# Patient Record
Sex: Male | Born: 1965 | Race: White | Hispanic: No | Marital: Married | Smoking: Former smoker
Health system: Southern US, Community
[De-identification: ages and names within clinical notes are randomized; demographics above are authoritative.]

## PROBLEM LIST (undated history)

## (undated) DIAGNOSIS — N289 Disorder of kidney and ureter, unspecified: Secondary | ICD-10-CM

## (undated) DIAGNOSIS — I10 Essential (primary) hypertension: Secondary | ICD-10-CM

## (undated) HISTORY — DX: Essential (primary) hypertension: I10

## (undated) HISTORY — PX: TONSILLECTOMY: SUR1361

---

## 1998-07-03 ENCOUNTER — Emergency Department (HOSPITAL_COMMUNITY): Admission: EM | Admit: 1998-07-03 | Discharge: 1998-07-04 | Payer: Self-pay | Admitting: Emergency Medicine

## 1998-07-03 ENCOUNTER — Encounter: Payer: Self-pay | Admitting: Emergency Medicine

## 2000-09-25 ENCOUNTER — Other Ambulatory Visit: Admission: RE | Admit: 2000-09-25 | Discharge: 2000-09-25 | Payer: Self-pay | Admitting: Urology

## 2005-12-13 ENCOUNTER — Encounter: Admission: RE | Admit: 2005-12-13 | Discharge: 2005-12-13 | Payer: Self-pay | Admitting: Internal Medicine

## 2013-01-05 ENCOUNTER — Ambulatory Visit (INDEPENDENT_AMBULATORY_CARE_PROVIDER_SITE_OTHER): Payer: Managed Care, Other (non HMO) | Admitting: Family Medicine

## 2013-01-05 VITALS — BP 110/70 | HR 93 | Temp 98.0°F | Resp 16 | Ht 72.0 in | Wt 260.0 lb

## 2013-01-05 DIAGNOSIS — J209 Acute bronchitis, unspecified: Secondary | ICD-10-CM

## 2013-01-05 DIAGNOSIS — R05 Cough: Secondary | ICD-10-CM

## 2013-01-05 DIAGNOSIS — R059 Cough, unspecified: Secondary | ICD-10-CM

## 2013-01-05 MED ORDER — HYDROCODONE-HOMATROPINE 5-1.5 MG/5ML PO SYRP: 5.0000 mL | ORAL_SOLUTION | Freq: Three times a day (TID) | ORAL | Status: DC | PRN

## 2013-01-05 MED ORDER — AZITHROMYCIN 250 MG PO TABS: ORAL_TABLET | ORAL | Status: DC

## 2013-01-05 NOTE — Progress Notes (Signed)
  Subjective:    Patient ID: Christopher Stark, male    DOB: [birth year], 47 y.o.   MRN: [id number] This chart was scribed for Elvina Sidle, MD by Danella Maiers, ED Scribe. This patient was seen in room 8 and the patient's care was started at 12:14 PM.  Chief Complaint  Patient presents with  . Cough    congestion x 1 week    HPI HPI Comments: Christopher Stark is a 48 y.o. male who presents to the [facility] complaining of cough, congestion, diaphoresis for the past 4-5 days. He states his son was sick 4-5 weeks ago and his wife was sick one week ago with sinus infection and acute pneumonia. They went to [employer] together this week and he has been feeling bad ever since. His wife was given a Z-pack with improvement within 2-3 days. He states he has not been sick in 2-3 years. He denies rhinorrhea. He does not smoke. He denies h/o asthma.   Review of Systems No nausea, vomiting, fever    Objective:   Physical Exam HEENT: mucopurulent nasal discharge Chest: few deep ronchi right chest Skin:  clear    Assessment & Plan:      Acute bronchitis - Plan: azithromycin (ZITHROMAX Z-PAK) 250 MG tablet, HYDROcodone-homatropine (HYCODAN) 5-1.5 MG/5ML syrup  Signed, Elvina Sidle, MD

## 2013-01-05 NOTE — Patient Instructions (Signed)

## 2013-07-09 ENCOUNTER — Ambulatory Visit (INDEPENDENT_AMBULATORY_CARE_PROVIDER_SITE_OTHER): Payer: Managed Care, Other (non HMO) | Admitting: Physician Assistant

## 2013-07-09 VITALS — BP 120/80 | HR 110 | Temp 98.1°F | Resp 16 | Ht 72.0 in | Wt 273.0 lb

## 2013-07-09 DIAGNOSIS — R059 Cough, unspecified: Secondary | ICD-10-CM

## 2013-07-09 DIAGNOSIS — R0981 Nasal congestion: Secondary | ICD-10-CM

## 2013-07-09 DIAGNOSIS — J988 Other specified respiratory disorders: Secondary | ICD-10-CM

## 2013-07-09 DIAGNOSIS — R05 Cough: Secondary | ICD-10-CM

## 2013-07-09 DIAGNOSIS — J22 Unspecified acute lower respiratory infection: Secondary | ICD-10-CM

## 2013-07-09 DIAGNOSIS — J3489 Other specified disorders of nose and nasal sinuses: Secondary | ICD-10-CM

## 2013-07-09 MED ORDER — HYDROCODONE-HOMATROPINE 5-1.5 MG/5ML PO SYRP: 5.0000 mL | ORAL_SOLUTION | Freq: Three times a day (TID) | ORAL | Status: DC | PRN

## 2013-07-09 MED ORDER — AZITHROMYCIN 250 MG PO TABS: ORAL_TABLET | ORAL | Status: DC

## 2013-07-09 MED ORDER — BENZONATATE 100 MG PO CAPS: 100.0000 mg | ORAL_CAPSULE | Freq: Three times a day (TID) | ORAL | Status: DC | PRN

## 2013-07-09 NOTE — Progress Notes (Signed)
   Subjective:    Patient ID: Christopher Stark, male    DOB: 02/16/1966, 48 y.o.   MRN: [id number]  HPI 48 year old male presents for evaluation of 5 day history of nasal congestion, sore throat, bilateral ear pressure, dry, hacking cough.  Has had subjective fever and chills. Admits to sinus pain/pressure, chest pain while coughing. Has taken an OTC allergy medicine and used Dayquil/Nyquil which has not helped.   No known sick contacts.  Hx of bronchitis.  Patient is otherwise healthy with no other concerns today.     Review of Systems  Constitutional: Positive for fever (subjective) and chills.  HENT: Positive for congestion, ear pain, postnasal drip, rhinorrhea, sinus pressure and sore throat.   Respiratory: Negative for cough, chest tightness, shortness of breath and wheezing.   Gastrointestinal: Negative for nausea, vomiting and abdominal pain.  Neurological: Negative for dizziness and headaches.       Objective:   Physical Exam  Constitutional: He is oriented to person, place, and time. He appears well-developed and well-nourished.  HENT:  Head: Normocephalic and atraumatic.  Right Ear: Hearing, tympanic membrane, external ear and ear canal normal.  Left Ear: Hearing, tympanic membrane, external ear and ear canal normal.  Mouth/Throat: Uvula is midline, oropharynx is clear and moist and mucous membranes are normal.  Eyes: Conjunctivae are normal.  Neck: Normal range of motion. Neck supple.  Cardiovascular: Normal rate, regular rhythm and normal heart sounds.   Pulmonary/Chest: Effort normal and breath sounds normal.  Lymphadenopathy:    He has no cervical adenopathy.  Neurological: He is alert and oriented to person, place, and time.  Psychiatric: He has a normal mood and affect. His behavior is normal. Judgment and thought content normal.          Assessment & Plan:  Lower respiratory infection (e.g., bronchitis, pneumonia, pneumonitis, pulmonitis) - Plan: azithromycin  (ZITHROMAX) 250 MG tablet  Cough - Plan: HYDROcodone-homatropine (HYCODAN) 5-1.5 MG/5ML syrup, benzonatate (TESSALON) 100 MG capsule  Nasal congestion  Will treat with Zpack as directed Hycodan qhs prn cough Continue OTC Zyrtec daily Push fluids Follow up if symptoms worsen or fail to improve.

## 2017-04-25 ENCOUNTER — Encounter (HOSPITAL_COMMUNITY): Payer: Self-pay | Admitting: Emergency Medicine

## 2017-04-25 ENCOUNTER — Emergency Department (HOSPITAL_COMMUNITY): Payer: Managed Care, Other (non HMO)

## 2017-04-25 ENCOUNTER — Emergency Department (HOSPITAL_COMMUNITY)
Admission: EM | Admit: 2017-04-25 | Discharge: 2017-04-25 | Disposition: A | Discharge status: Home / Self Care | Payer: Managed Care, Other (non HMO) | Attending: Emergency Medicine | Admitting: Emergency Medicine

## 2017-04-25 DIAGNOSIS — Z79899 Other long term (current) drug therapy: Secondary | ICD-10-CM | POA: Insufficient documentation

## 2017-04-25 DIAGNOSIS — R1031 Right lower quadrant pain: Secondary | ICD-10-CM | POA: Diagnosis present

## 2017-04-25 DIAGNOSIS — N200 Calculus of kidney: Secondary | ICD-10-CM | POA: Diagnosis not present

## 2017-04-25 DIAGNOSIS — Z87891 Personal history of nicotine dependence: Secondary | ICD-10-CM | POA: Insufficient documentation

## 2017-04-25 HISTORY — DX: Disorder of kidney and ureter, unspecified: N28.9

## 2017-04-25 LAB — URINALYSIS, ROUTINE W REFLEX MICROSCOPIC
Bilirubin Urine: NEGATIVE
Glucose, UA: NEGATIVE mg/dL
Ketones, ur: 5 mg/dL — AB
Leukocytes, UA: NEGATIVE
Nitrite: NEGATIVE
Protein, ur: 30 mg/dL — AB
Specific Gravity, Urine: 1.019 (ref 1.005–1.030)
pH: 5 (ref 5.0–8.0)

## 2017-04-25 LAB — CBC WITH DIFFERENTIAL/PLATELET
Basophils Absolute: 0.1 10*3/uL (ref 0.0–0.1)
Basophils Relative: 1 %
EOS ABS: 0.1 10*3/uL (ref 0.0–0.7)
Eosinophils Relative: 1 %
HCT: 44.8 % (ref 39.0–52.0)
Hemoglobin: 14.8 g/dL (ref 13.0–17.0)
LYMPHS ABS: 1.9 10*3/uL (ref 0.7–4.0)
Lymphocytes Relative: 19 %
MCH: 28.9 pg (ref 26.0–34.0)
MCHC: 33 g/dL (ref 30.0–36.0)
MCV: 87.5 fL (ref 78.0–100.0)
Monocytes Absolute: 0.6 10*3/uL (ref 0.1–1.0)
Monocytes Relative: 6 %
NEUTROS PCT: 75 %
Neutro Abs: 7.9 10*3/uL — ABNORMAL HIGH (ref 1.7–7.7)
PLATELETS: 224 10*3/uL (ref 150–400)
RBC: 5.12 MIL/uL (ref 4.22–5.81)
RDW: 14.1 % (ref 11.5–15.5)
WBC: 10.5 10*3/uL (ref 4.0–10.5)

## 2017-04-25 LAB — BASIC METABOLIC PANEL
Anion gap: 10 (ref 5–15)
BUN: 12 mg/dL (ref 6–20)
CALCIUM: 9.2 mg/dL (ref 8.9–10.3)
CHLORIDE: 107 mmol/L (ref 101–111)
CO2: 22 mmol/L (ref 22–32)
CREATININE: 0.98 mg/dL (ref 0.61–1.24)
GFR calc non Af Amer: >60 mL/min (ref >60)
Glucose, Bld: 140 mg/dL — ABNORMAL HIGH (ref 65–99)
Potassium: 3.8 mmol/L (ref 3.5–5.1)
SODIUM: 139 mmol/L (ref 135–145)

## 2017-04-25 MED ORDER — ONDANSETRON HCL 4 MG/2ML IJ SOLN
4.0000 mg | Freq: Once | INTRAMUSCULAR | Status: AC
  Administered 2017-04-25: 4 mg via INTRAVENOUS
  Filled 2017-04-25: qty 2

## 2017-04-25 MED ORDER — OXYCODONE-ACETAMINOPHEN 5-325 MG PO TABS
1.0000 | ORAL_TABLET | ORAL | Status: DC | PRN
  Administered 2017-04-25: 1 via ORAL
  Filled 2017-04-25: qty 1

## 2017-04-25 MED ORDER — SODIUM CHLORIDE 0.9 % IV SOLN
INTRAVENOUS | Status: DC
  Administered 2017-04-25: 17:00:00 via INTRAVENOUS

## 2017-04-25 MED ORDER — IBUPROFEN 800 MG PO TABS: 800.0000 mg | ORAL_TABLET | Freq: Three times a day (TID) | ORAL | 0 refills | Status: AC | PRN

## 2017-04-25 MED ORDER — FENTANYL CITRATE (PF) 100 MCG/2ML IJ SOLN
50.0000 ug | Freq: Once | INTRAMUSCULAR | Status: AC
  Administered 2017-04-25: 50 ug via INTRAVENOUS
  Filled 2017-04-25: qty 2

## 2017-04-25 MED ORDER — ONDANSETRON 4 MG PO TBDP: 4.0000 mg | ORAL_TABLET | Freq: Three times a day (TID) | ORAL | 0 refills | Status: AC | PRN

## 2017-04-25 MED ORDER — OXYCODONE-ACETAMINOPHEN 5-325 MG PO TABS: 1.0000 | ORAL_TABLET | Freq: Four times a day (QID) | ORAL | 0 refills | Status: AC | PRN

## 2017-04-25 MED ORDER — SODIUM CHLORIDE 0.9 % IV BOLUS (SEPSIS)
1000.0000 mL | Freq: Once | INTRAVENOUS | Status: AC
  Administered 2017-04-25: 1000 mL via INTRAVENOUS

## 2017-04-25 MED ORDER — KETOROLAC TROMETHAMINE 15 MG/ML IJ SOLN
15.0000 mg | Freq: Once | INTRAMUSCULAR | Status: AC
  Administered 2017-04-25: 15 mg via INTRAVENOUS
  Filled 2017-04-25: qty 1

## 2017-04-25 MED ORDER — TAMSULOSIN HCL 0.4 MG PO CAPS: 0.4000 mg | ORAL_CAPSULE | Freq: Every day | ORAL | 0 refills | Status: AC

## 2017-04-25 NOTE — ED Provider Notes (Signed)
[facility]-EMERGENCY DEPT Provider Note   CSN: [id number] Arrival date & time: 04/25/17  1254   History   Chief Complaint Chief Complaint  Patient presents with  . Flank Pain    HPI Christopher Stark is a 52 y.o. male with a hx of nephrolithiasis who presents to the ED from PCP office for progressively worsening R flank pain that started this morning at 10:00. States pain was waxing/waning- has become constant. States it is currently a 12/10 in severity without specific alleviating/aggravating factors.  Reports associated nausea with an episode of nonbloody emesis.  Patient was in the process of establishing care with a new primary care provider in [facility] when symptoms started to worsen.  Patient states that they performed a UA which showed blood in his urine, relayed they drew blood, but do not have results.  States x-ray was obtained to evaluate for nephrolithiasis and was inconclusive.  Patient was instructed to come to the emergency department for CT scan. Patient states hx of similar with kidney stones. Denies fever, dysuria, frequency, urgency, testicular pain, or abdominal pain.   HPI  Past Medical History:  Diagnosis Date  . Renal disorder    kindey stones    There are no active problems to display for this patient.   Past Surgical History:  Procedure Laterality Date  . TONSILLECTOMY         Home Medications    Prior to Admission medications   Medication Sig Start Date End Date Taking? Authorizing Provider  azithromycin (ZITHROMAX) 250 MG tablet Take 2 tabs PO x 1 dose, then 1 tab PO QD x 4 days 07/09/13   Rhoderick Moody M, PA-C  benzonatate (TESSALON) 100 MG capsule Take 1-2 capsules (100-200 mg total) by mouth 3 (three) times daily as needed for cough. 07/09/13   Nelva Nay, PA-C  HYDROcodone-homatropine (HYCODAN) 5-1.5 MG/5ML syrup Take 5 mLs by mouth every 8 (eight) hours as needed for cough. 07/09/13   Nelva Nay, PA-C    Family History Family History  Problem Relation Age of Onset  . Healthy Mother   . Healthy Father   . Healthy Sister     Social History Social History   Tobacco Use  . Smoking status: Former Smoker  Substance Use Topics  . Alcohol use: No  . Drug use: No     Allergies   Patient has no known allergies.   Review of Systems Review of Systems  Constitutional: Negative for chills and fever.  Respiratory: Negative for shortness of breath.   Cardiovascular: Negative for chest pain.  Gastrointestinal: Positive for nausea and vomiting. Negative for blood in stool, constipation and diarrhea.  Genitourinary: Positive for flank pain and hematuria (per UA at PCP). Negative for discharge, dysuria, frequency, scrotal swelling, testicular pain and urgency.  All other systems reviewed and are negative.  Physical Exam Updated Vital Signs BP (!) 141/94 (BP Location: Left Arm)   Pulse 83   Temp 98.8 F (37.1 C) (Oral)   Resp 16   Ht 5\' 11"  (1.803 m)   Wt 127.5 kg (281 lb)   SpO2 96%   BMI 39.19 kg/m   Physical Exam  Constitutional: He appears well-developed and well-nourished. He appears distressed (mild secondary to pain).  HENT:  Head: Normocephalic and atraumatic.  Eyes: Conjunctivae are normal. Right eye exhibits no discharge. Left eye exhibits no discharge.  Cardiovascular: Normal rate and regular rhythm.  No murmur heard. Pulmonary/Chest: Breath sounds normal.  No respiratory distress. He has no wheezes. He has no rales.  Abdominal: Soft. Normal appearance. He exhibits no distension. There is no tenderness. There is no rigidity, no rebound, no guarding, no CVA tenderness and no tenderness at McBurney's point.  Neurological: He is alert.  Clear speech.   Skin: Skin is warm and dry. No rash noted.  Psychiatric: He has a normal mood and affect. His behavior is normal.  Nursing note and vitals reviewed.  ED Treatments / Results  Labs Results for orders placed  or performed during the hospital encounter of 04/25/17  Urinalysis, Routine w reflex microscopic- may I&O cath if menses  Result Value Ref Range   Color, Urine YELLOW YELLOW   APPearance CLEAR CLEAR   Specific Gravity, Urine 1.019 1.005 - 1.030   pH 5.0 5.0 - 8.0   Glucose, UA NEGATIVE NEGATIVE mg/dL   Hgb urine dipstick LARGE (A) NEGATIVE   Bilirubin Urine NEGATIVE NEGATIVE   Ketones, ur 5 (A) NEGATIVE mg/dL   Protein, ur 30 (A) NEGATIVE mg/dL   Nitrite NEGATIVE NEGATIVE   Leukocytes, UA NEGATIVE NEGATIVE   RBC / HPF 6-30 0 - 5 RBC/hpf   WBC, UA 0-5 0 - 5 WBC/hpf   Bacteria, UA RARE (A) NONE SEEN   Squamous Epithelial / LPF 0-5 (A) NONE SEEN   Mucus PRESENT   Basic metabolic panel  Result Value Ref Range   Sodium 139 135 - 145 mmol/L   Potassium 3.8 3.5 - 5.1 mmol/L   Chloride 107 101 - 111 mmol/L   CO2 22 22 - 32 mmol/L   Glucose, Bld 140 (H) 65 - 99 mg/dL   BUN 12 6 - 20 mg/dL   Creatinine, Ser 1.61 0.61 - 1.24 mg/dL   Calcium 9.2 8.9 - 09.6 mg/dL   GFR calc non Af Amer >60 >60 mL/min   GFR calc Af Amer >60 >60 mL/min   Anion gap 10 5 - 15  CBC with Differential  Result Value Ref Range   WBC 10.5 4.0 - 10.5 K/uL   RBC 5.12 4.22 - 5.81 MIL/uL   Hemoglobin 14.8 13.0 - 17.0 g/dL   HCT 04.5 40.9 - 81.1 %   MCV 87.5 78.0 - 100.0 fL   MCH 28.9 26.0 - 34.0 pg   MCHC 33.0 30.0 - 36.0 g/dL   RDW 91.4 78.2 - 95.6 %   Platelets 224 150 - 400 K/uL   Neutrophils Relative % 75 %   Neutro Abs 7.9 (H) 1.7 - 7.7 K/uL   Lymphocytes Relative 19 %   Lymphs Abs 1.9 0.7 - 4.0 K/uL   Monocytes Relative 6 %   Monocytes Absolute 0.6 0.1 - 1.0 K/uL   Eosinophils Relative 1 %   Eosinophils Absolute 0.1 0.0 - 0.7 K/uL   Basophils Relative 1 %   Basophils Absolute 0.1 0.0 - 0.1 K/uL   EKG  EKG Interpretation None      Radiology Ct Renal Stone Study  Result Date: 04/25/2017 CLINICAL DATA:  Acute right flank pain. EXAM: CT ABDOMEN AND PELVIS WITHOUT CONTRAST TECHNIQUE:  Multidetector CT imaging of the abdomen and pelvis was performed following the standard protocol without IV contrast. COMPARISON:  None. FINDINGS: Lower chest: No acute abnormality. Hepatobiliary: No gallstones are noted. Fatty infiltration of the liver is noted with sparing adjacent to gallbladder fossa. No biliary dilatation is noted. Pancreas: Unremarkable. No pancreatic ductal dilatation or surrounding inflammatory changes. Spleen: Normal in size without focal abnormality. Adrenals/Urinary Tract: Adrenal glands  appear normal. Left kidney and ureter are unremarkable. Mild right hydroureteronephrosis is noted secondary to 4 mm calculus at right ureterovesical junction. Urinary bladder is decompressed. Stomach/Bowel: Stomach is within normal limits. Appendix appears normal. No evidence of bowel wall thickening, distention, or inflammatory changes. Vascular/Lymphatic: No significant vascular findings are present. No enlarged abdominal or pelvic lymph nodes. Reproductive: Prostate is unremarkable. Other: 4.3 x 3.5 cm rounded well-defined abnormality is seen in the mesenteric region of the left upper quadrant with peripheral calcifications, most consistent with benign cyst or mass lesion. Musculoskeletal: No acute or significant osseous findings. IMPRESSION: Mild right hydroureteronephrosis is noted secondary to 4 mm calculus at right ureterovesical junction. Fatty infiltration of the liver. 4.3 cm rounded well-defined soft tissue abnormality is seen in mesenteric region of left upper quadrant with peripheral calcifications, most consistent with benign cyst, lymph node or mass lesion. Electronically Signed   By: Lupita RaiderJames  Green Jr, M.D.   On: 04/25/2017 15:41   Procedures Procedures (including critical care time)  Medications Ordered in ED Medications  oxyCODONE-acetaminophen (PERCOCET/ROXICET) 5-325 MG per tablet 1 tablet (1 tablet Oral Given 04/25/17 1410)  sodium chloride 0.9 % bolus 1,000 mL (1,000 mLs  Intravenous New Bag/Given 04/25/17 1500)    And  0.9 %  sodium chloride infusion (not administered)  ketorolac (TORADOL) 15 MG/ML injection 15 mg (not administered)  fentaNYL (SUBLIMAZE) injection 50 mcg (not administered)  fentaNYL (SUBLIMAZE) injection 50 mcg (50 mcg Intravenous Given 04/25/17 1500)  ondansetron (ZOFRAN) injection 4 mg (4 mg Intravenous Given 04/25/17 1500)    Initial Impression / Assessment and Plan / ED Course  I have reviewed the triage vital signs and the nursing notes.  Pertinent labs & imaging results that were available during my care of the patient were reviewed by me and considered in my medical decision making (see chart for details).    Patient presents with R flank pain. Patient is nontoxic appearing, initially appears uncomfortable secondary to pain, vitals WNL other than elevated BP which normalized throughout visit.  4mm calculus at the R ureterovesical junction with mild right hydroureteronephrosis on CT scan. Incidental 4.3 cm rounded well-defined soft tissue abnormality in mesenteric region of LUQ with peripheral calcifications, most consistent with benign cyst, lymph node or mass lesion- informed patient need for follow up of this with PCP. Lab work reviewed- BUN/creatinine WNL, no leukocytosis, no signs of infection on UA. Patient's pain improved in the ED with Fentanyl and Toradol. N/V improved with Zofran. Patient is tolerating PO in the emergency department. Will DC with ibuprofen, percocet, flomax, and zofran. [place] Controlled Substance reporting System queried. I discussed results, treatment plan, need for urology follow-up, and return precautions with the patient and his wife. Provided opportunity for questions, patient and his wife confirmed understanding and are in agreement with plan.   Final Clinical Impressions(s) / ED Diagnoses   Final diagnoses:  Nephrolithiasis    ED Discharge Orders        Ordered    ibuprofen (ADVIL,MOTRIN) 800 MG  tablet  Every 8 hours PRN     04/25/17 1631    oxyCODONE-acetaminophen (PERCOCET/ROXICET) 5-325 MG tablet  Every 6 hours PRN     04/25/17 1631    tamsulosin (FLOMAX) 0.4 MG CAPS capsule  Daily after supper     04/25/17 1631    ondansetron (ZOFRAN ODT) 4 MG disintegrating tablet  Every 8 hours PRN     04/25/17 1631       Petrucelli, Spanish ValleySamantha R, PA-C 04/25/17  1610    Derwood Kaplan, MD 04/26/17 [phone]

## 2017-04-25 NOTE — ED Notes (Signed)
Pt in CT.

## 2017-04-25 NOTE — Discharge Instructions (Addendum)
You were seen in the emergency department and diagnosed with a kidney stone. The stone is 4 mm in size and located at the ureterovesical junction- this is where the ureter (the tube that carries urine from your kidney to your bladder) enters your bladder. You were also noted to have an incidental finding in the left upper part of your abdomen as described on the CT scan below- please mention this to your primary care provider for re-evaluation.   Your kidney stone will likely pass on its own. We are sending you home with medications for treatment in the interim.   - Flomax- take this once daily with supper- this helps to pass the stone - Ibuprofen 800mg - take this once every 8 hours for pain - Percocet- take this once ever 6 hours for severe pain.  - Zofran- take this once every 8 hours as needed for nausea   Follow up with the urologist in your discharge instructions for re-evaluation within 3 days. Return to the emergency for any new or worsening symptoms including worsening pain not controlled by these medicines, inability to keep fluids down, fever, or any other concerns you may have.   CT Renal results:     IMPRESSION:  Mild right hydroureteronephrosis is noted secondary to 4 mm calculus  at right ureterovesical junction.     Fatty infiltration of the liver.     4.3 cm rounded well-defined soft tissue abnormality is seen in  mesenteric region of left upper quadrant with peripheral  calcifications, most consistent with benign cyst, lymph node or mass  lesion.      Medication Order discussed:  ODT zofran, wait 15 minutes, eat healthy diner, Flomax & Percocet with food.

## 2017-04-25 NOTE — ED Triage Notes (Addendum)
Patient presents ambulatory with wife stating there were getting a check up this am when pt began having right sided flank pain. Had blood work, urine test and xray completed. Patient sent here due to unsure of kidney stone. Pt able to void.

## 2017-04-26 ENCOUNTER — Other Ambulatory Visit: Payer: Self-pay | Admitting: Internal Medicine

## 2017-04-26 DIAGNOSIS — R109 Unspecified abdominal pain: Secondary | ICD-10-CM

## 2017-04-26 DIAGNOSIS — R319 Hematuria, unspecified: Secondary | ICD-10-CM

## 2017-04-26 DIAGNOSIS — Z87442 Personal history of urinary calculi: Secondary | ICD-10-CM

## 2017-09-04 ENCOUNTER — Encounter: Payer: Self-pay | Admitting: Gastroenterology

## 2017-10-25 ENCOUNTER — Ambulatory Visit (AMBULATORY_SURGERY_CENTER): Payer: Self-pay

## 2017-10-25 VITALS — Ht 71.5 in

## 2017-10-25 DIAGNOSIS — Z1211 Encounter for screening for malignant neoplasm of colon: Secondary | ICD-10-CM

## 2017-10-25 MED ORDER — PEG-KCL-NACL-NASULF-NA ASC-C 140 G PO SOLR: 1.0000 | Freq: Once | ORAL | Status: AC

## 2017-10-25 NOTE — Progress Notes (Signed)
Per pt, no allergies to soy or egg products.Pt not taking any weight loss meds or using  O2 at home.  Pt refused emmi video. 

## 2017-10-26 ENCOUNTER — Encounter: Payer: Self-pay | Admitting: Gastroenterology

## 2017-11-07 ENCOUNTER — Encounter: Payer: Self-pay | Admitting: Gastroenterology

## 2017-11-07 ENCOUNTER — Ambulatory Visit (AMBULATORY_SURGERY_CENTER): Payer: Managed Care, Other (non HMO) | Admitting: Gastroenterology

## 2017-11-07 VITALS — BP 123/91 | HR 69 | Temp 98.6°F | Resp 19 | Ht 71.5 in | Wt 281.0 lb

## 2017-11-07 DIAGNOSIS — Z1211 Encounter for screening for malignant neoplasm of colon: Secondary | ICD-10-CM

## 2017-11-07 MED ORDER — SODIUM CHLORIDE 0.9 % IV SOLN: 500.0000 mL | Freq: Once | INTRAVENOUS | Status: DC

## 2017-11-07 NOTE — Patient Instructions (Signed)
Please read handout on Diverticulosis. Continue present medications.    YOU HAD AN ENDOSCOPIC PROCEDURE TODAY AT THE [facility]:   Refer to the procedure report that was given to you for any specific questions about what was found during the examination.  If the procedure report does not answer your questions, please call your gastroenterologist to clarify.  If you requested that your care partner not be given the details of your procedure findings, then the procedure report has been included in a sealed envelope for you to review at your convenience later.  YOU SHOULD EXPECT: Some feelings of bloating in the abdomen. Passage of more gas than usual.  Walking can help get rid of the air that was put into your GI tract during the procedure and reduce the bloating. If you had a lower endoscopy (such as a colonoscopy or flexible sigmoidoscopy) you may notice spotting of blood in your stool or on the toilet paper. If you underwent a bowel prep for your procedure, you may not have a normal bowel movement for a few days.  Please Note:  You might notice some irritation and congestion in your nose or some drainage.  This is from the oxygen used during your procedure.  There is no need for concern and it should clear up in a day or so.  SYMPTOMS TO REPORT IMMEDIATELY:   Following lower endoscopy (colonoscopy or flexible sigmoidoscopy):  Excessive amounts of blood in the stool  Significant tenderness or worsening of abdominal pains  Swelling of the abdomen that is new, acute  Fever of 100F or higher    For urgent or emergent issues, a gastroenterologist can be reached at any hour by calling [phone].   DIET:  We do recommend a small meal at first, but then you may proceed to your regular diet.  Drink plenty of fluids but you should avoid alcoholic beverages for 24 hours.  ACTIVITY:  You should plan to take it easy for the rest of today and you should NOT DRIVE or use heavy  machinery until tomorrow (because of the sedation medicines used during the test).    FOLLOW UP: Our staff will call the number listed on your records the next business day following your procedure to check on you and address any questions or concerns that you may have regarding the information given to you following your procedure. If we do not reach you, we will leave a message.  However, if you are feeling well and you are not experiencing any problems, there is no need to return our call.  We will assume that you have returned to your regular daily activities without incident.  If any biopsies were taken you will be contacted by phone or by letter within the next 1-3 weeks.  Please call us at [phone] if you have not heard about the biopsies in 3 weeks.    SIGNATURES/CONFIDENTIALITY: You and/or your care partner have signed paperwork which will be entered into your electronic medical record.  These signatures attest to the fact that that the information above on your After Visit Summary has been reviewed and is understood.  Full responsibility of the confidentiality of this discharge information lies with you and/or your care-partner.

## 2017-11-07 NOTE — Progress Notes (Signed)
Pt's states no medical or surgical changes since previsit or office visit. 

## 2017-11-07 NOTE — Progress Notes (Signed)
Report given to PACU, vss 

## 2017-11-07 NOTE — Op Note (Signed)
[facility] Patient Name: Christopher Stark Procedure Date: 11/07/2017 9:47 AM MRN: [id number] Endoscopist: Sherilyn Cooter L. Myrtie Neither , MD Age: 52 Referring MD:  Date of Birth: [birth year] Gender: Male Account #: [id number] Procedure:                Colonoscopy Indications:              Screening for colorectal malignant neoplasm, This                            is the patient's first colonoscopy Medicines:                Monitored Anesthesia Care Procedure:                Pre-Anesthesia Assessment:                           - Prior to the procedure, a History and Physical                            was performed, and patient medications and                            allergies were reviewed. The patient's tolerance of                            previous anesthesia was also reviewed. The risks                            and benefits of the procedure and the sedation                            options and risks were discussed with the patient.                            All questions were answered, and informed consent                            was obtained. Prior Anticoagulants: The patient has                            taken no previous anticoagulant or antiplatelet                            agents. ASA Grade Assessment: II - A patient with                            mild systemic disease. After reviewing the risks                            and benefits, the patient was deemed in                            satisfactory condition to undergo the procedure.  After obtaining informed consent, the colonoscope                            was passed under direct vision. Throughout the                            procedure, the patient's blood pressure, pulse, and                            oxygen saturations were monitored continuously. The                            Model CF-HQ190L [id number]) scope was introduced                            through the anus and  advanced to the the cecum,                            identified by appendiceal orifice and ileocecal                            valve. The colonoscopy was performed without                            difficulty. The patient tolerated the procedure                            well. The quality of the bowel preparation was                            excellent. The ileocecal valve, appendiceal                            orifice, and rectum were photographed. Scope In: 9:57:13 AM Scope Out: 10:07:32 AM Scope Withdrawal Time: 0 hours 9 minutes 2 seconds  Total Procedure Duration: 0 hours 10 minutes 19 seconds  Findings:                 The perianal and digital rectal examinations were                            normal.                           Multiple small-mouthed diverticula were found in                            the left colon.                           The exam was otherwise without abnormality on                            direct and retroflexion views. Complications:            No immediate complications. Estimated Blood Loss:  Estimated blood loss: none. Impression:               - Diverticulosis in the left colon.                           - The examination was otherwise normal on direct                            and retroflexion views.                           - No specimens collected. Recommendation:           - Patient has a contact number available for                            emergencies. The signs and symptoms of potential                            delayed complications were discussed with the                            patient. Return to normal activities tomorrow.                            Written discharge instructions were provided to the                            patient.                           - Resume previous diet.                           - Continue present medications.                           - Repeat colonoscopy in 10 years for screening                             purposes. Henry L. Myrtie Neither, MD 11/07/2017 10:09:50 AM This report has been signed electronically.

## 2017-11-08 ENCOUNTER — Telehealth: Payer: Self-pay

## 2017-11-08 NOTE — Telephone Encounter (Signed)
  Follow up Call-  Call back number 11/07/2017  Post procedure Call Back phone  # [phone]  Permission to leave phone message Yes  Some recent data might be hidden     Patient questions:  Do you have a fever, pain , or abdominal swelling? No. Pain Score  0 *  Have you tolerated food without any problems? Yes.    Have you been able to return to your normal activities? Yes.    Do you have any questions about your discharge instructions: Diet   No. Medications  No. Follow up visit  No.  Do you have questions or concerns about your Care? No.  Actions: * If pain score is 4 or above: No action needed, pain <4.

## 2019-08-17 IMAGING — CT CT RENAL STONE PROTOCOL
2 of 4 series · 16 of 46 positions shown, 18 images · non-contrast
Comparison: None.

CLINICAL DATA: Acute right flank pain.

EXAM:
CT ABDOMEN AND PELVIS WITHOUT CONTRAST
TECHNIQUE: Multidetector CT imaging of the abdomen and pelvis was performed
following the standard protocol without IV contrast.

[Series 2: axial st · axial · 0.96mm/px · z∈[-559,-69]mm · 13 of 113 slices shown, 15 images]
[im 8/113  soft-tissue]
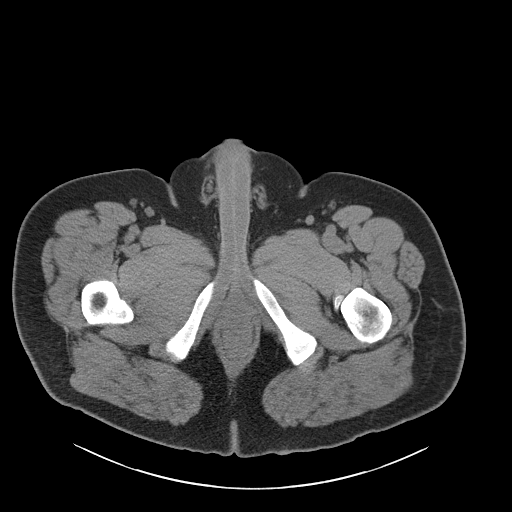
[im 8/113  bone]
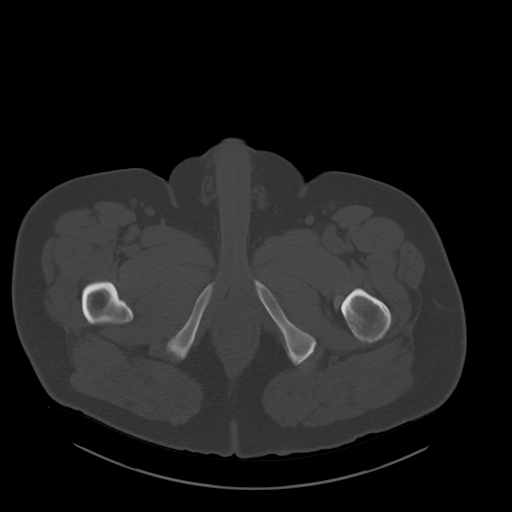
[im 15/113  soft-tissue]
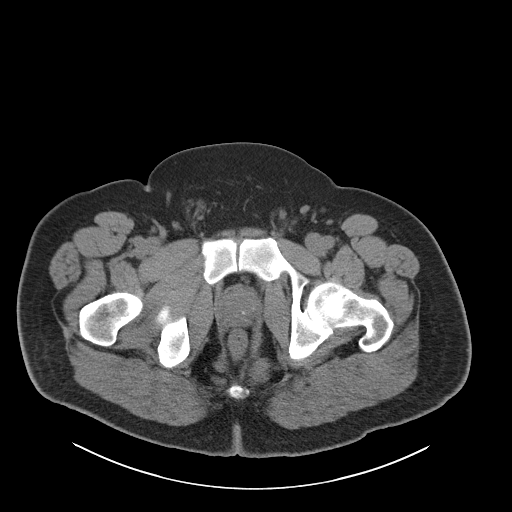
[im 22/113  soft-tissue]
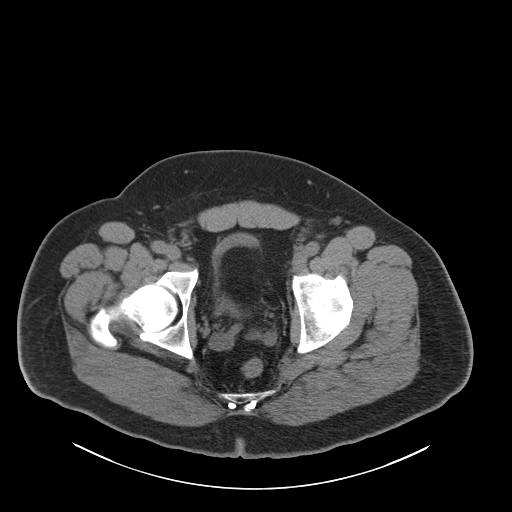
[im 36/113  soft-tissue]
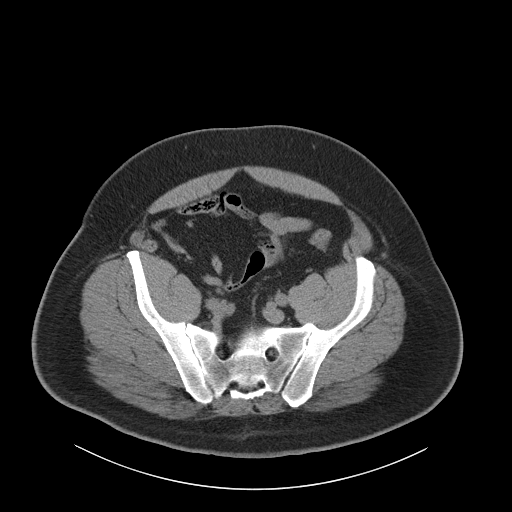
[im 43/113  soft-tissue]
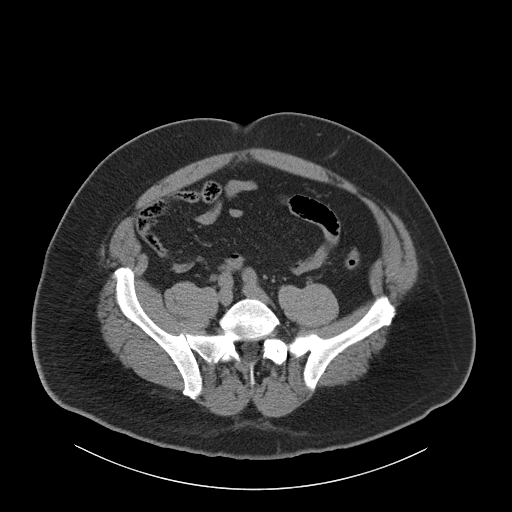
[im 50/113  soft-tissue]
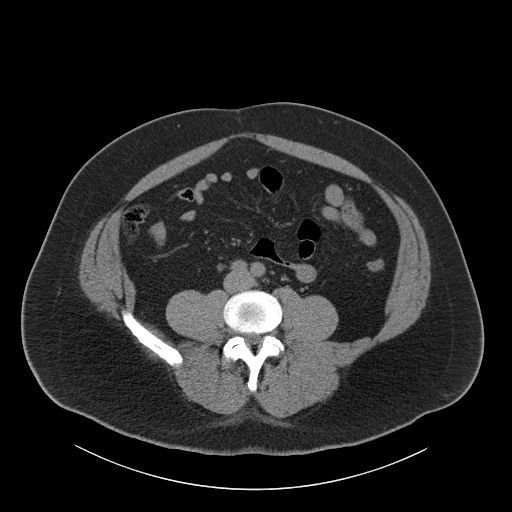
[im 57/113  soft-tissue]
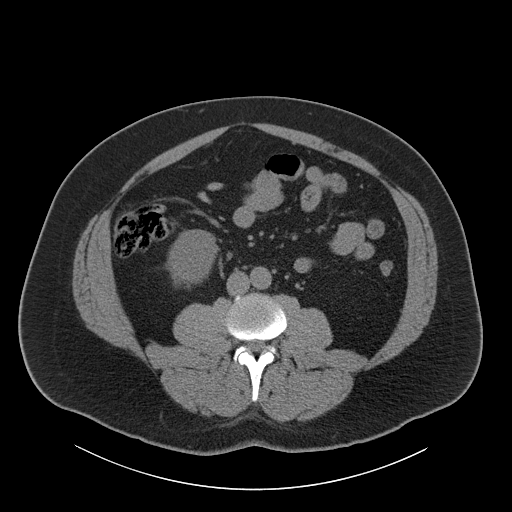
[im 64/113  soft-tissue]
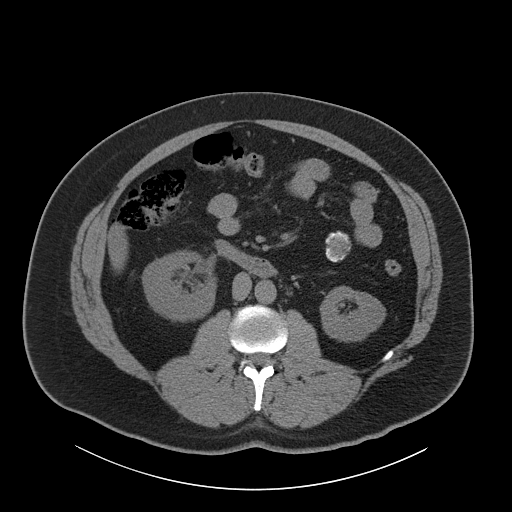
[im 71/113  soft-tissue]
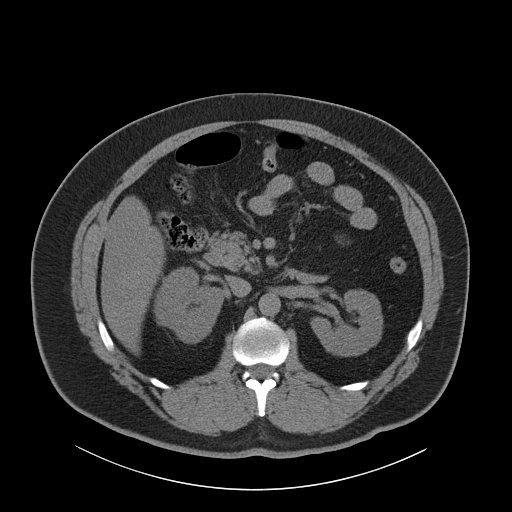
[im 71/113  bone]
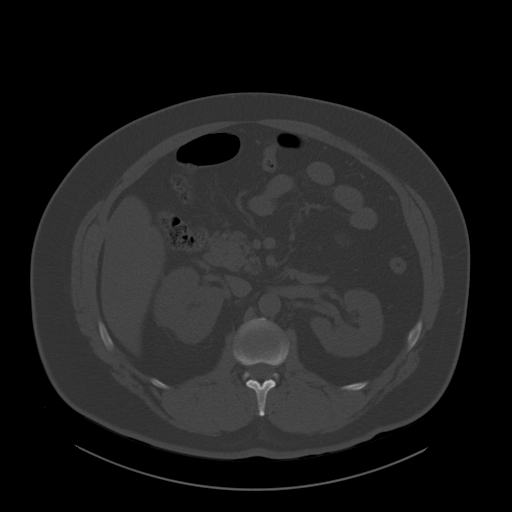
[im 78/113  soft-tissue]
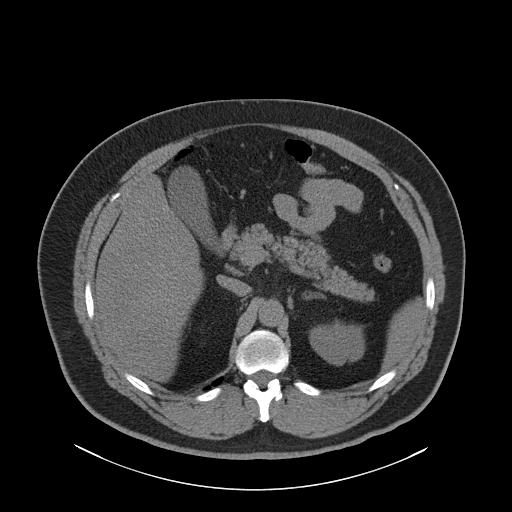
[im 92/113  soft-tissue]
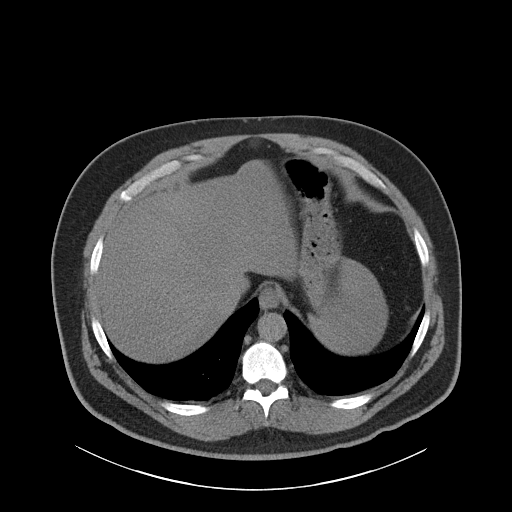
[im 99/113  soft-tissue]
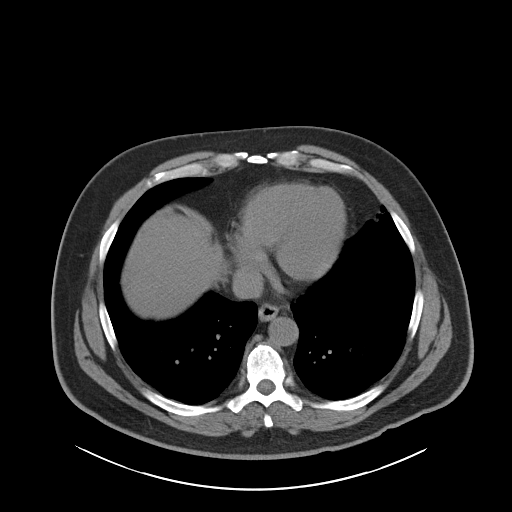
[im 106/113  soft-tissue]
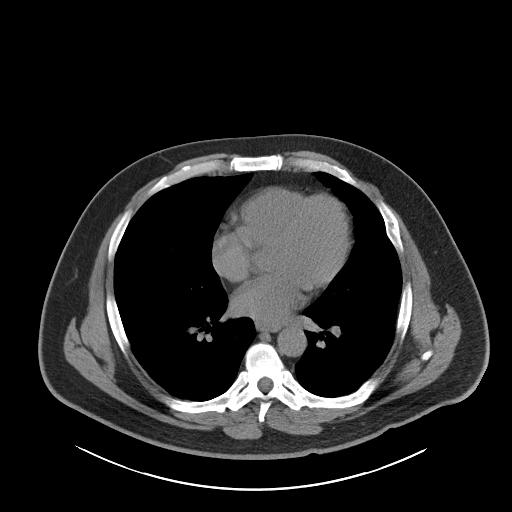

[Series 4: coronal · coronal · 0.94mm/px · 3 of 179 slices shown]
[im 60/179  soft-tissue]
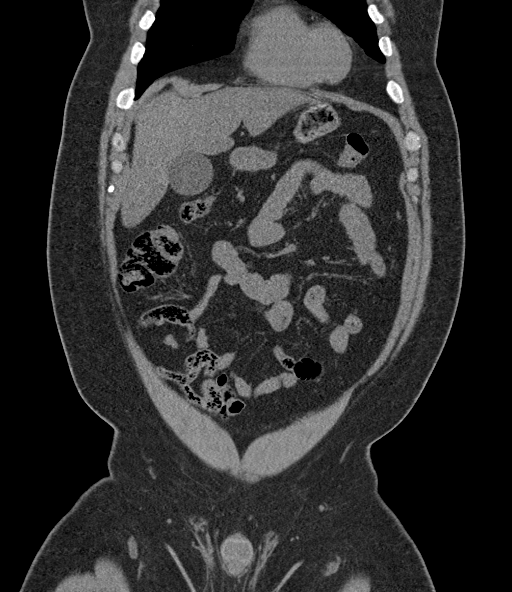
[im 80/179  soft-tissue]
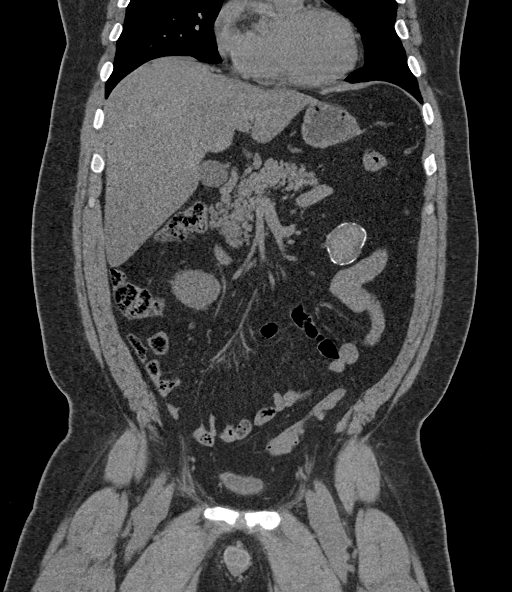
[im 99/179  soft-tissue]
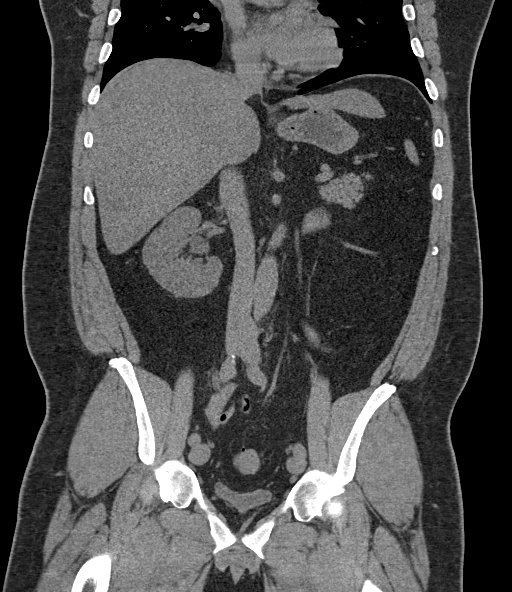

[16 of 46 positions shown; findings below may reference images not displayed]

FINDINGS: Lower chest: No acute abnormality.

Hepatobiliary: No gallstones are noted. Fatty infiltration of the
liver is noted with sparing adjacent to gallbladder fossa. No
biliary dilatation is noted.

Pancreas: Unremarkable. No pancreatic ductal dilatation or
surrounding inflammatory changes.

Spleen: Normal in size without focal abnormality.

Adrenals/Urinary Tract: Adrenal glands appear normal. Left kidney
and ureter are unremarkable. Mild right hydroureteronephrosis is
noted secondary to 4 mm calculus at right ureterovesical junction.
Urinary bladder is decompressed.

Stomach/Bowel: Stomach is within normal limits. Appendix appears
normal. No evidence of bowel wall thickening, distention, or
inflammatory changes.

Vascular/Lymphatic: No significant vascular findings are present. No
enlarged abdominal or pelvic lymph nodes.

Reproductive: Prostate is unremarkable.

Other: 4.3 x 3.5 cm rounded well-defined abnormality is seen in the
mesenteric region of the left upper quadrant with peripheral
calcifications, most consistent with benign cyst or mass lesion.

Musculoskeletal: No acute or significant osseous findings.
IMPRESSION: Mild right hydroureteronephrosis is noted secondary to 4 mm calculus
at right ureterovesical junction.

Fatty infiltration of the liver.

4.3 cm rounded well-defined soft tissue abnormality is seen in
mesenteric region of left upper quadrant with peripheral
calcifications, most consistent with benign cyst, lymph node or mass
lesion.

## 2020-04-15 ENCOUNTER — Other Ambulatory Visit: Payer: Managed Care, Other (non HMO)

## 2020-04-15 DIAGNOSIS — Z20822 Contact with and (suspected) exposure to covid-19: Secondary | ICD-10-CM

## 2020-04-16 LAB — SARS-COV-2, NAA 2 DAY TAT

## 2020-04-16 LAB — NOVEL CORONAVIRUS, NAA: SARS-CoV-2, NAA: DETECTED — AB

## 2020-04-17 ENCOUNTER — Telehealth: Payer: Self-pay | Admitting: Physician Assistant

## 2020-04-17 NOTE — Telephone Encounter (Signed)
Called to discuss with patient about COVID-19 symptoms and the use of one of the available treatments for those with mild to moderate Covid symptoms and at a high risk of hospitalization.  Pt appears to qualify for outpatient treatment due to co-morbid conditions and/or a member of an at-risk group in accordance with the FDA Emergency Use Authorization.   Unable to reach pt. [id number].  Christopher Stark   

## 2021-03-29 ENCOUNTER — Other Ambulatory Visit: Payer: Self-pay | Admitting: Surgery

## 2021-03-29 DIAGNOSIS — K6389 Other specified diseases of intestine: Secondary | ICD-10-CM

## 2021-04-05 ENCOUNTER — Ambulatory Visit
Admission: RE | Admit: 2021-04-05 | Discharge: 2021-04-05 | Disposition: A | Discharge status: Home / Self Care | Payer: Managed Care, Other (non HMO) | Source: Ambulatory Visit | Attending: Surgery | Admitting: Surgery

## 2021-04-05 DIAGNOSIS — K6389 Other specified diseases of intestine: Secondary | ICD-10-CM

## 2021-04-05 MED ORDER — IOPAMIDOL (ISOVUE-300) INJECTION 61%
100.0000 mL | Freq: Once | INTRAVENOUS | Status: AC | PRN
  Administered 2021-04-05: 100 mL via INTRAVENOUS

## 2022-11-09 ENCOUNTER — Other Ambulatory Visit (HOSPITAL_COMMUNITY): Payer: Self-pay

## 2022-11-09 MED ORDER — MOUNJARO 12.5 MG/0.5ML ~~LOC~~ SOAJ
12.5000 mg | SUBCUTANEOUS | 0 refills | Status: AC
  Filled 2022-11-09: qty 6, 84d supply, fill #0

## 2022-11-10 ENCOUNTER — Other Ambulatory Visit (HOSPITAL_COMMUNITY): Payer: Self-pay

## 2023-01-04 ENCOUNTER — Other Ambulatory Visit (HOSPITAL_COMMUNITY): Payer: Self-pay

## 2023-01-04 MED ORDER — MOUNJARO 15 MG/0.5ML ~~LOC~~ SOAJ
0.5000 mg | SUBCUTANEOUS | 1 refills | Status: DC
  Filled 2023-01-04: qty 6, 90d supply, fill #0
  Filled 2023-03-31: qty 6, 90d supply, fill #1

## 2023-01-15 ENCOUNTER — Other Ambulatory Visit (HOSPITAL_COMMUNITY): Payer: Self-pay

## 2023-04-02 ENCOUNTER — Other Ambulatory Visit (HOSPITAL_COMMUNITY): Payer: Self-pay

## 2023-06-19 ENCOUNTER — Other Ambulatory Visit (HOSPITAL_COMMUNITY): Payer: Self-pay

## 2023-06-19 MED ORDER — MOUNJARO 15 MG/0.5ML ~~LOC~~ SOAJ
15.0000 mg | SUBCUTANEOUS | 1 refills | Status: DC
  Filled 2023-06-19: qty 2, 28d supply, fill #0
  Filled 2023-06-26: qty 6, 84d supply, fill #0
  Filled 2023-10-03: qty 6, 84d supply, fill #1

## 2023-06-26 ENCOUNTER — Other Ambulatory Visit (HOSPITAL_COMMUNITY): Payer: Self-pay

## 2023-07-10 ENCOUNTER — Other Ambulatory Visit (HOSPITAL_COMMUNITY): Payer: Self-pay

## 2023-07-28 IMAGING — CT CT ABD-PELV W/ CM
1 of 3 series · 13 of 32 positions shown, 18 images · IV contrast (agent unspecified)
Comparison: CT without contrast 04/25/2017

CLINICAL DATA: Reassess left upper quadrant mass or cyst.

EXAM:
CT ABDOMEN AND PELVIS WITH CONTRAST
TECHNIQUE: Multidetector CT imaging of the abdomen and pelvis was performed
using the standard protocol following bolus administration of
intravenous contrast.

[Series 2: a/p w/ 5mm · axial · 0.97mm/px · z∈[-496,+9]mm · 13 of 115 slices shown, 18 images]
[im 7/115  soft-tissue]
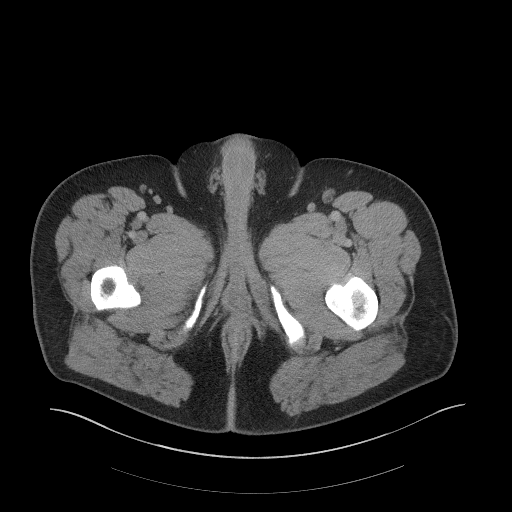
[im 7/115  bone]
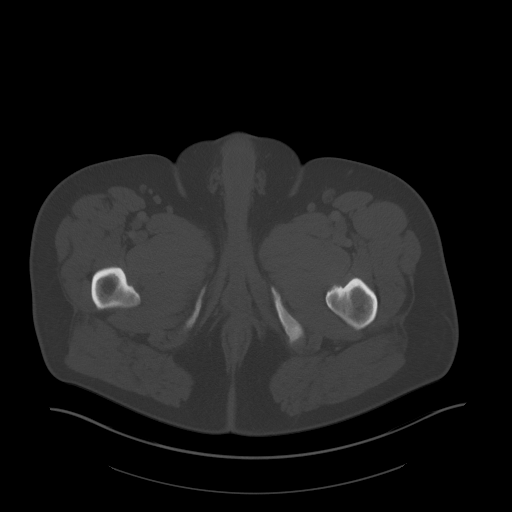
[im 20/115  soft-tissue]
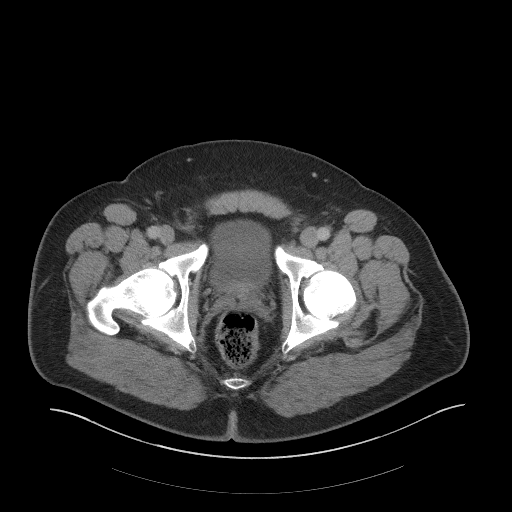
[im 26/115  soft-tissue]
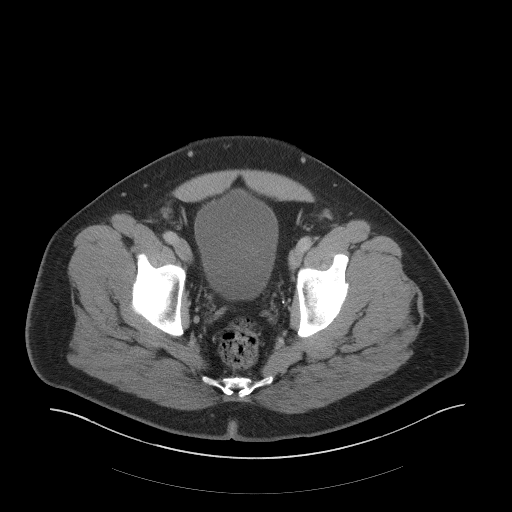
[im 32/115  soft-tissue]
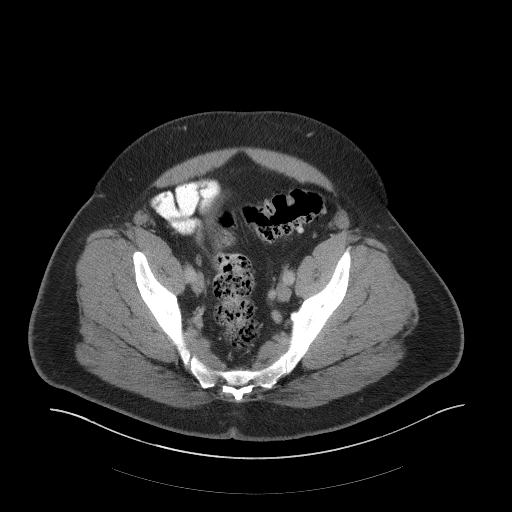
[im 45/115  soft-tissue]
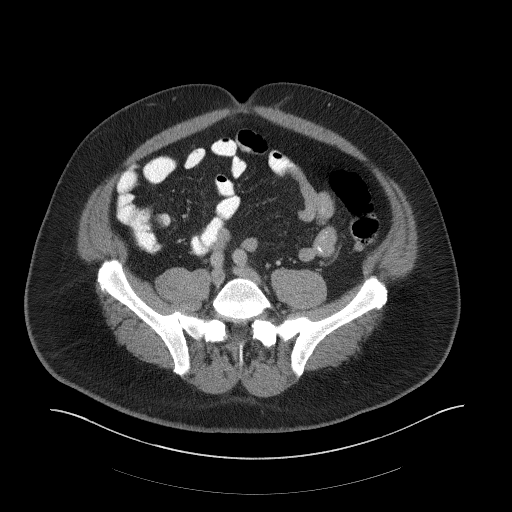
[im 51/115  soft-tissue]
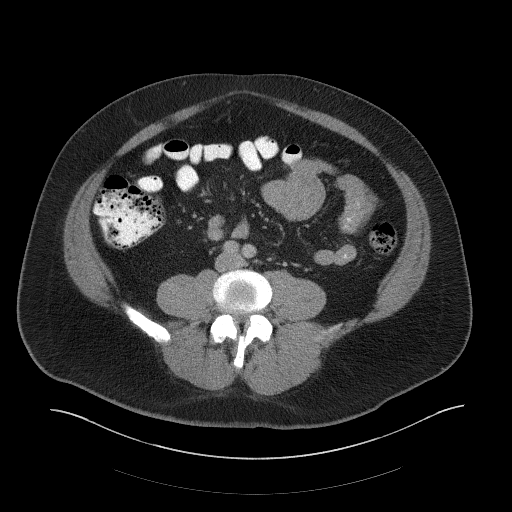
[im 64/115  soft-tissue]
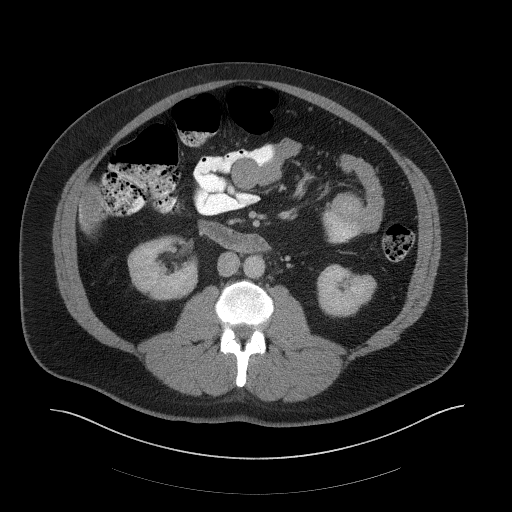
[im 70/115  soft-tissue]
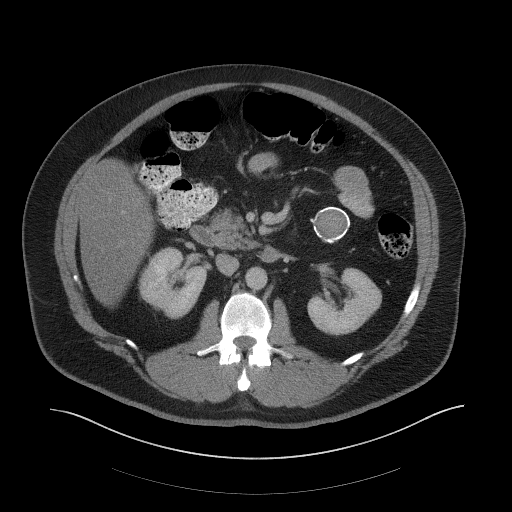
[im 83/115  soft-tissue]
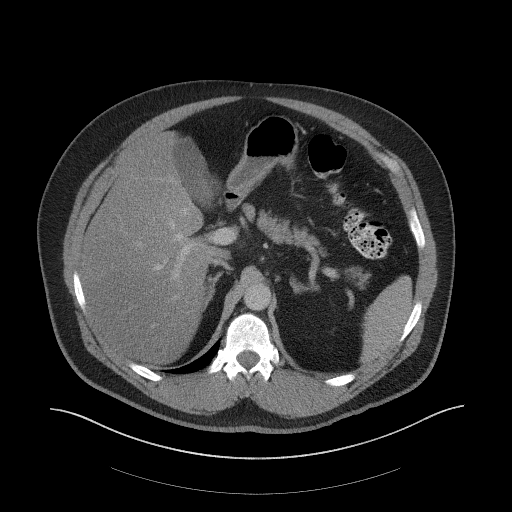
[im 83/115  bone]
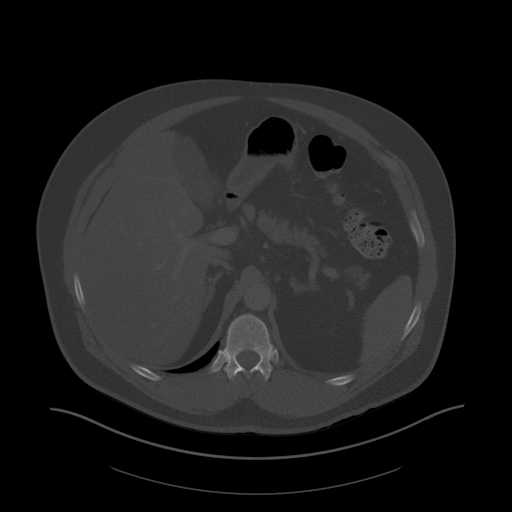
[im 89/115  soft-tissue]
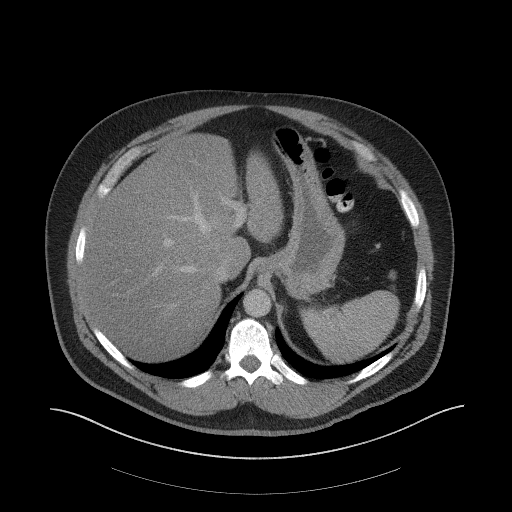
[im 89/115  lung]
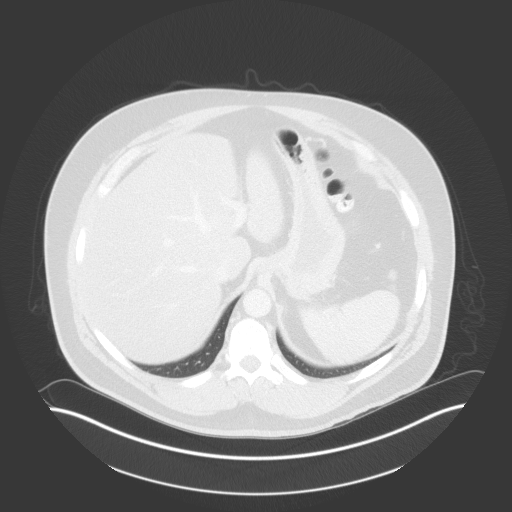
[im 96/115  soft-tissue]
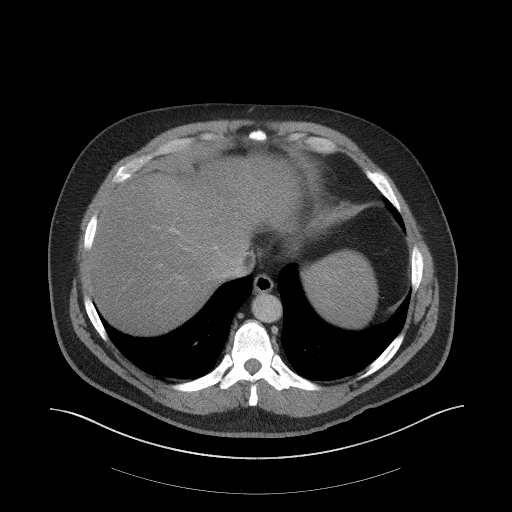
[im 96/115  lung]
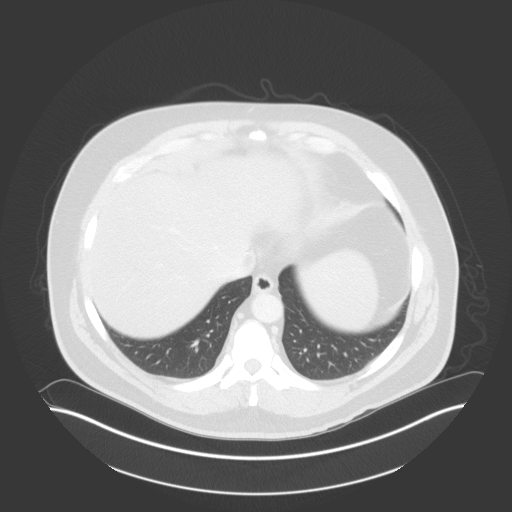
[im 102/115  lung]
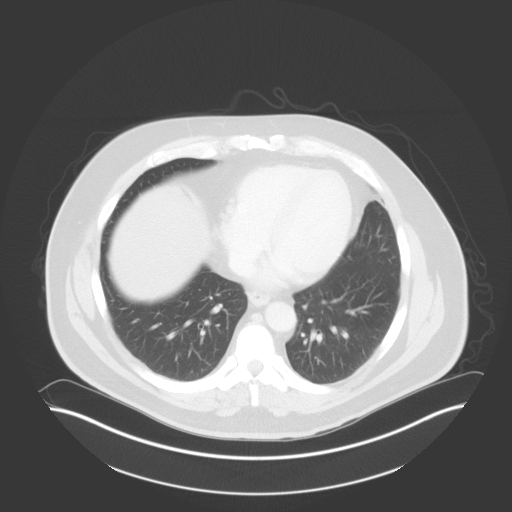
[im 108/115  soft-tissue]
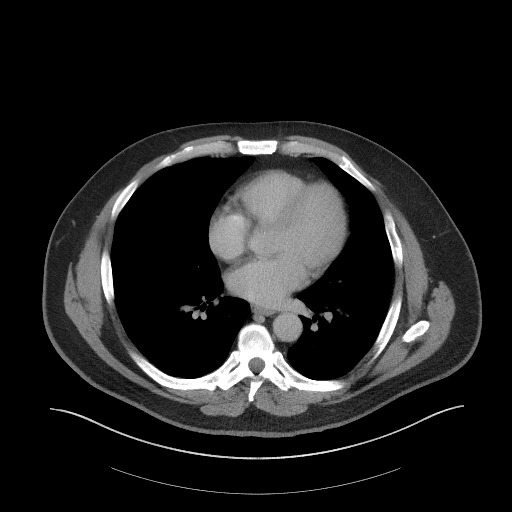
[im 108/115  lung]
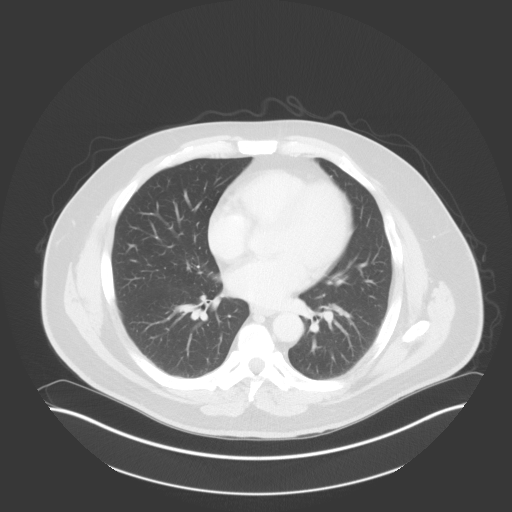

[13 of 32 positions shown; findings below may reference images not displayed]

RADIATION DOSE REDUCTION: This exam was performed according to the
departmental dose-optimization program which includes automated
exposure control, adjustment of the mA and/or kV according to
patient size and/or use of iterative reconstruction technique.

CONTRAST:  100mL FYZN6P-JPP IOPAMIDOL (FYZN6P-JPP) INJECTION 61%
FINDINGS: Lower chest: No acute abnormality.

Hepatobiliary: 22.5 cm length moderately steatotic liver, similar
degree of steatosis as on the prior study.

Pancreas: No mass, ductal dilatation or adjacent edema.

Spleen: No mass enhancement or splenomegaly.

Adrenals/Urinary Tract: There is no adrenal mass. A wedge-shaped
defect in the anterior lower pole left kidney is again noted
consistent with scarring.

There are 2 tiny hypodensities in the right renal cortex which are
too small to characterize, unable to confirm if these were present
on the prior noncontrast exam. There is a 1 mm nonobstructing
caliceal stone in the lower pole left kidney.

No stone is seen on the right. There is no hydronephrosis or
ureteral stone no bladder thickening, no other focal GU tract
abnormality.

Stomach/Bowel: No dilatation or wall thickening including the
appendix. Moderate stool retention ascending colon. Sigmoid
diverticulosis is seen without evidence of acute diverticulitis.

Vascular/Lymphatic: Mild scattered aortic atherosclerosis. No new
enlarged abdominal or pelvic lymph nodes are present. There are
slightly prominent chronic periportal and portacaval space
retroperitoneal nodes up to 1.2 cm in short axis, stable.

Reproductive: Prostate is unremarkable.

Other: There is a rim calcified rounded mesenteric mass in left mid
abdomen again noted, measuring 52 Hounsfield units and 3.6 x 3.4 x
4.0 cm, stable. There was a prior CT abdomen from 07/03/1998 which
could not be retrieved from PACS, but the report describing these
images does not mention this. There is no free air, hemorrhage or
fluid.

Musculoskeletal: Anterior bridging osteophytes again noted both SI
joints. Facet hypertrophy lumbar spine. Small umbilical and inguinal
fat hernias.
IMPRESSION: 1. Stable rim calcified left mid abdominal mesenteric mass, 3.6 x
3.4 x 4.0 cm with density above that of fluid. Stability over nearly
4 years time is consistent with a benign entity. This is probably
either a partially calcified chronic mesenteric hematoma, rim
calcified mesenteric infarct or old peripherally calcified
adenopathy.
2. Stable mildly prominent retroperitoneal nodes.
3. Nonobstructive micronephrolithiasis.
4. Moderate hepatic steatosis.
5. Constipation and diverticulosis.
6. Umbilical and inguinal fat hernias.
7. Aortic atherosclerosis.

## 2023-09-13 ENCOUNTER — Other Ambulatory Visit (HOSPITAL_COMMUNITY): Payer: Self-pay

## 2023-09-13 MED ORDER — METFORMIN HCL ER 500 MG PO TB24
500.0000 mg | ORAL_TABLET | Freq: Two times a day (BID) | ORAL | 1 refills | Status: AC
  Filled 2023-09-13: qty 180, 90d supply, fill #0

## 2023-09-21 ENCOUNTER — Other Ambulatory Visit (HOSPITAL_COMMUNITY): Payer: Self-pay

## 2023-12-19 ENCOUNTER — Other Ambulatory Visit (HOSPITAL_COMMUNITY): Payer: Self-pay

## 2023-12-20 ENCOUNTER — Other Ambulatory Visit (HOSPITAL_COMMUNITY): Payer: Self-pay

## 2023-12-20 MED ORDER — MOUNJARO 15 MG/0.5ML ~~LOC~~ SOAJ
15.0000 mg | SUBCUTANEOUS | 1 refills | Status: AC
  Filled 2023-12-20: qty 6, 84d supply, fill #0
